# Patient Record
Sex: Male | Born: 1990 | Race: White | Hispanic: No | Marital: Single
Health system: Southern US, Community
[De-identification: ages and names within clinical notes are randomized; demographics above are authoritative.]

## PROBLEM LIST (undated history)

## (undated) ENCOUNTER — Ambulatory Visit: Admission: EM | Payer: Medicaid Other | Source: Home / Self Care

## (undated) DIAGNOSIS — F209 Schizophrenia, unspecified: Secondary | ICD-10-CM

## (undated) DIAGNOSIS — F419 Anxiety disorder, unspecified: Secondary | ICD-10-CM

## (undated) DIAGNOSIS — F909 Attention-deficit hyperactivity disorder, unspecified type: Secondary | ICD-10-CM

## (undated) HISTORY — PX: ANKLE SURGERY: SHX546

---
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
## 2012-03-23 ENCOUNTER — Emergency Department: Payer: Self-pay | Admitting: Emergency Medicine

## 2012-03-23 LAB — CBC
HCT: 41.1 % (ref 40.0–52.0)
HGB: 14.4 g/dL (ref 13.0–18.0)
MCH: 30.1 pg (ref 26.0–34.0)
MCHC: 35.1 g/dL (ref 32.0–36.0)
MCV: 86 fL (ref 80–100)
RBC: 4.78 10*6/uL (ref 4.40–5.90)
RDW: 12.5 % (ref 11.5–14.5)
WBC: 6.2 10*3/uL (ref 3.8–10.6)

## 2012-03-23 LAB — URINALYSIS, COMPLETE
Bilirubin,UR: NEGATIVE
Glucose,UR: NEGATIVE mg/dL (ref 0–75)
Leukocyte Esterase: NEGATIVE
Nitrite: NEGATIVE
Ph: 7 (ref 4.5–8.0)
RBC,UR: NONE SEEN /HPF (ref 0–5)
WBC UR: 1 /HPF (ref 0–5)

## 2012-03-23 LAB — BASIC METABOLIC PANEL
Calcium, Total: 8.7 mg/dL — ABNORMAL LOW (ref 9.0–10.7)
Co2: 24 mmol/L (ref 21–32)
Creatinine: 1.17 mg/dL (ref 0.60–1.30)
Glucose: 80 mg/dL (ref 65–99)
Potassium: 3.6 mmol/L (ref 3.5–5.1)

## 2012-03-24 LAB — DRUG SCREEN, URINE
Amphetamines, Ur Screen: NEGATIVE (ref ?–1000)
Barbiturates, Ur Screen: NEGATIVE (ref ?–200)
Cannabinoid 50 Ng, Ur ~~LOC~~: POSITIVE (ref ?–50)
MDMA (Ecstasy)Ur Screen: NEGATIVE (ref ?–500)
Methadone, Ur Screen: NEGATIVE (ref ?–300)
Opiate, Ur Screen: NEGATIVE (ref ?–300)
Phencyclidine (PCP) Ur S: NEGATIVE (ref ?–25)
Tricyclic, Ur Screen: NEGATIVE (ref ?–1000)

## 2012-03-24 LAB — ETHANOL: Ethanol %: <0.003 % (ref 0.000–0.080)

## 2018-10-13 ENCOUNTER — Encounter: Payer: Self-pay | Admitting: Emergency Medicine

## 2018-10-13 ENCOUNTER — Other Ambulatory Visit: Payer: Self-pay

## 2018-10-13 ENCOUNTER — Emergency Department
Admission: EM | Admit: 2018-10-13 | Discharge: 2018-10-13 | Disposition: A | Discharge status: Home / Self Care | Payer: Self-pay | Attending: Emergency Medicine | Admitting: Emergency Medicine

## 2018-10-13 ENCOUNTER — Emergency Department: Payer: Self-pay

## 2018-10-13 DIAGNOSIS — Y999 Unspecified external cause status: Secondary | ICD-10-CM | POA: Insufficient documentation

## 2018-10-13 DIAGNOSIS — S93401A Sprain of unspecified ligament of right ankle, initial encounter: Secondary | ICD-10-CM | POA: Insufficient documentation

## 2018-10-13 DIAGNOSIS — Y929 Unspecified place or not applicable: Secondary | ICD-10-CM | POA: Insufficient documentation

## 2018-10-13 DIAGNOSIS — Y9375 Activity, martial arts: Secondary | ICD-10-CM | POA: Insufficient documentation

## 2018-10-13 DIAGNOSIS — X509XXA Other and unspecified overexertion or strenuous movements or postures, initial encounter: Secondary | ICD-10-CM | POA: Insufficient documentation

## 2018-10-13 MED ORDER — TRAMADOL HCL 50 MG PO TABS
50.0000 mg | ORAL_TABLET | Freq: Once | ORAL | Status: AC
  Administered 2018-10-13: 50 mg via ORAL
  Filled 2018-10-13: qty 1

## 2018-10-13 MED ORDER — TRAMADOL HCL 50 MG PO TABS: 50.0000 mg | ORAL_TABLET | Freq: Two times a day (BID) | ORAL | 0 refills | Status: DC | PRN

## 2018-10-13 MED ORDER — IBUPROFEN 600 MG PO TABS: 600.0000 mg | ORAL_TABLET | Freq: Three times a day (TID) | ORAL | 0 refills | Status: DC | PRN

## 2018-10-13 MED ORDER — IBUPROFEN 600 MG PO TABS
600.0000 mg | ORAL_TABLET | Freq: Once | ORAL | Status: AC
  Administered 2018-10-13: 600 mg via ORAL
  Filled 2018-10-13: qty 1

## 2018-10-13 NOTE — Discharge Instructions (Signed)
Follow discharge care instructions and take medication as directed.  Ambulate with support for 2 to 3 days as needed.

## 2018-10-13 NOTE — ED Triage Notes (Signed)
Pt reports 3 days ago was in martial arts and rolled his right ankle. Pt states unable to walk on it now.

## 2018-10-13 NOTE — ED Notes (Signed)
See triage note  Presents with pain to right ankle/foot  States he was in a martial arts class 2 days ago   Rolled his ankle and felt a pop  Min swelling noted  Good pulses

## 2018-10-13 NOTE — ED Provider Notes (Signed)
[facility] Emergency Department Provider Note   ____________________________________________   First MD Initiated Contact with Patient 10/13/18 [phone]     (approximate)  I have reviewed the triage vital signs and the nursing notes.   HISTORY  Chief Complaint Ankle Pain    HPI Brandon Krueger is a 28 y.o. male patient complain of right ankle pain secondary to martial art trauma 2 days ago.  Patient state using "Rice" therapy and ambulating with crutches since incident.  Patient rates the pain is 8/10.  Patient described the pain is "achy".         History reviewed. No pertinent past medical history.  There are no active problems to display for this patient.   History reviewed. No pertinent surgical history.  Prior to Admission medications   Medication Sig Start Date End Date Taking? Authorizing Provider  gabapentin (NEURONTIN) 100 MG capsule Take 100 mg by mouth 3 (three) times daily.   Yes [provider]  ibuprofen (ADVIL) 600 MG tablet Take 1 tablet (600 mg total) by mouth every 8 (eight) hours as needed. 10/13/18   Joni Reining, PA-C  traMADol (ULTRAM) 50 MG tablet Take 1 tablet (50 mg total) by mouth every 12 (twelve) hours as needed. 10/13/18   Joni Reining, PA-C    Allergies Patient has no allergy information on record.  No family history on file.  Social History Social History   Tobacco Use  . Smoking status: Not on file  Substance Use Topics  . Alcohol use: Not on file  . Drug use: Not on file    Review of Systems Constitutional: No fever/chills Eyes: No visual changes. ENT: No sore throat. Cardiovascular: Denies chest pain. Respiratory: Denies shortness of breath. Gastrointestinal: No abdominal pain.  No nausea, no vomiting.  No diarrhea.  No constipation. Genitourinary: Negative for dysuria. Musculoskeletal: Right ankle pain.   Skin: Negative for rash. Neurological: Negative for headaches, focal weakness or  numbness.   ____________________________________________   PHYSICAL EXAM:  VITAL SIGNS: ED Triage Vitals  Enc Vitals Group     BP 10/13/18 0951 129/73     Pulse Rate 10/13/18 0951 89     Resp 10/13/18 0951 20     Temp 10/13/18 0951 98.1 F (36.7 C)     Temp Source 10/13/18 0951 Oral     SpO2 10/13/18 0951 98 %     Weight 10/13/18 0952 150 lb (68 kg)     Height 10/13/18 0952 5\' 11"  (1.803 m)     Head Circumference --      Peak Flow --      Pain Score 10/13/18 0952 8     Pain Loc --      Pain Edu? --      Excl. in [place]? --    Constitutional: Alert and oriented. Well appearing and in no acute distress. Hematological/Lymphatic/Immunilogical: No cervical lymphadenopathy. Cardiovascular: Normal rate, regular rhythm. Grossly normal heart sounds.  Good peripheral circulation. Respiratory: Normal respiratory effort.  No retractions. Lungs CTAB. Musculoskeletal: No obvious deformity to the right ankle.  Patient is moderate guarding palpation the lateral malleolus.  Neurologic:  Normal speech and language. No gross focal neurologic deficits are appreciated. No gait instability. Skin:  Skin is warm, dry and intact. No rash noted.  Ecchymosis or abrasion. Psychiatric: Mood and affect are normal. Speech and behavior are normal.  ____________________________________________   LABS (all labs ordered are listed, but only abnormal results are displayed)  Labs Reviewed -  No data to display ____________________________________________  EKG   ____________________________________________  RADIOLOGY  ED MD interpretation:    Official radiology report(s): Dg Ankle Complete Right  Result Date: 10/13/2018 CLINICAL DATA:  Right ankle twisting injury 3 days ago performing martial arts. Pain. Initial encounter. EXAM: RIGHT ANKLE - COMPLETE 3+ VIEW COMPARISON:  None. FINDINGS: There is no evidence of fracture, dislocation, or joint effusion. There is no evidence of arthropathy or other focal  bone abnormality. Soft tissues are unremarkable. IMPRESSION: Normal exam. Electronically Signed   By: Inge Rise M.D.   On: 10/13/2018 10:12    ____________________________________________   PROCEDURES  Procedure(s) performed (including Critical Care):  Procedures   ____________________________________________   INITIAL IMPRESSION / ASSESSMENT AND PLAN / ED COURSE  As part of my medical decision making, I reviewed the following data within the [place] was evaluated in Emergency Department on 10/13/2018 for the symptoms described in the history of present illness. He was evaluated in the context of the global COVID-19 pandemic, which necessitated consideration that the patient might be at risk for infection with the SARS-CoV-2 virus that causes COVID-19. Institutional protocols and algorithms that pertain to the evaluation of patients at risk for COVID-19 are in a state of rapid change based on information released by regulatory bodies including the CDC and federal and state organizations. These policies and algorithms were followed during the patient's care in the ED.   Patient presents with right lateral ankle pain secondary to a sprain doing martial arts competition.  Physical exam revealed mild edema to the lateral aspect of the ankle.  Discussed neck x-ray findings with patient.  Patient placed in ankle splint and given discharge care instructions with a work note.  Patient advised follow-up [facility] clinic if condition persist.       ____________________________________________   FINAL CLINICAL IMPRESSION(S) / ED DIAGNOSES  Final diagnoses:  Sprain of right ankle, unspecified ligament, initial encounter     ED Discharge Orders         Ordered    ibuprofen (ADVIL) 600 MG tablet  Every 8 hours PRN     10/13/18 1024    traMADol (ULTRAM) 50 MG tablet  Every 12 hours PRN     10/13/18 1024           Note:  This document  was prepared using Dragon voice recognition software and may include unintentional dictation errors.    Sable Feil, PA-C 10/13/18 1028    Lavonia Drafts, MD 10/13/18 [phone]

## 2018-11-13 ENCOUNTER — Other Ambulatory Visit: Payer: Self-pay

## 2018-11-13 ENCOUNTER — Emergency Department
Admission: EM | Admit: 2018-11-13 | Discharge: 2018-11-14 | Disposition: A | Discharge status: Home / Self Care | Payer: Self-pay | Attending: Emergency Medicine | Admitting: Emergency Medicine

## 2018-11-13 DIAGNOSIS — F4324 Adjustment disorder with disturbance of conduct: Secondary | ICD-10-CM | POA: Insufficient documentation

## 2018-11-13 DIAGNOSIS — Z79899 Other long term (current) drug therapy: Secondary | ICD-10-CM | POA: Insufficient documentation

## 2018-11-13 DIAGNOSIS — Z20828 Contact with and (suspected) exposure to other viral communicable diseases: Secondary | ICD-10-CM | POA: Insufficient documentation

## 2018-11-13 DIAGNOSIS — Z008 Encounter for other general examination: Secondary | ICD-10-CM

## 2018-11-13 DIAGNOSIS — R4689 Other symptoms and signs involving appearance and behavior: Secondary | ICD-10-CM | POA: Diagnosis present

## 2018-11-13 DIAGNOSIS — R456 Violent behavior: Secondary | ICD-10-CM | POA: Insufficient documentation

## 2018-11-13 LAB — COMPREHENSIVE METABOLIC PANEL
ALT: 18 U/L (ref 0–44)
AST: 17 U/L (ref 15–41)
Albumin: 4.8 g/dL (ref 3.5–5.0)
Alkaline Phosphatase: 71 U/L (ref 38–126)
Anion gap: 10 (ref 5–15)
BUN: 12 mg/dL (ref 6–20)
CO2: 28 mmol/L (ref 22–32)
Calcium: 9.8 mg/dL (ref 8.9–10.3)
Chloride: 102 mmol/L (ref 98–111)
Creatinine, Ser: 1.15 mg/dL (ref 0.61–1.24)
GFR calc Af Amer: >60 mL/min (ref >60)
GFR calc non Af Amer: >60 mL/min (ref >60)
Glucose, Bld: 98 mg/dL (ref 70–99)
Potassium: 3.8 mmol/L (ref 3.5–5.1)
Sodium: 140 mmol/L (ref 135–145)
Total Bilirubin: 1.1 mg/dL (ref 0.3–1.2)
Total Protein: 7.6 g/dL (ref 6.5–8.1)

## 2018-11-13 LAB — CBC
HCT: 40.4 % (ref 39.0–52.0)
Hemoglobin: 13.8 g/dL (ref 13.0–17.0)
MCH: 30.1 pg (ref 26.0–34.0)
MCHC: 34.2 g/dL (ref 30.0–36.0)
MCV: 88 fL (ref 80.0–100.0)
Platelets: 258 10*3/uL (ref 150–400)
RBC: 4.59 MIL/uL (ref 4.22–5.81)
RDW: 12.2 % (ref 11.5–15.5)
WBC: 9.6 10*3/uL (ref 4.0–10.5)
nRBC: 0 % (ref 0.0–0.2)

## 2018-11-13 LAB — ETHANOL: Alcohol, Ethyl (B): <10 mg/dL (ref <10)
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
## 2018-11-13 NOTE — ED Notes (Signed)
Pt given hospital scrubs and belongings bag. 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 Belongings include: Sneakers, Tan pants w/ belt, Underwear, Black socks, [place] Information systems manager, sunglasses, cell phone, money clip.
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
## 2018-11-14 DIAGNOSIS — F4324 Adjustment disorder with disturbance of conduct: Secondary | ICD-10-CM | POA: Insufficient documentation

## 2018-11-14 DIAGNOSIS — R4689 Other symptoms and signs involving appearance and behavior: Secondary | ICD-10-CM

## 2018-11-14 LAB — SARS CORONAVIRUS 2 (TAT 6-24 HRS): SARS Coronavirus 2: NEGATIVE
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
## 2018-11-14 NOTE — ED Notes (Signed)
Patient's dad came to visit patient, Patient and He had supervised visit, visit was calm and pleasant, will continue to monitor. Patient is calm and cooperative.
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
## 2018-11-14 NOTE — ED Notes (Signed)
Hourly rounding reveals patient in room. No complaints, stable, in no acute distress. Q15 minute rounds and monitoring via Security Cameras to continue. 

## 2018-11-14 NOTE — ED Notes (Signed)
Snack and beverage given. 

## 2018-11-14 NOTE — BH Assessment (Signed)
Writer spoke with patient to complete updated/reassessment. Patient denies SI/HI and AV/H. 

## 2018-11-14 NOTE — ED Notes (Signed)
Nurse talked to Patient and He denies Si/hi or avh, states that He was upset about His brother telling His girlfriend that He cheated on her, but He never wanted to hurt anyone or himself, talked about His prison time and How He is glad to be out of jail and wants to do better in life, nurse will continue to monitor.

## 2018-11-14 NOTE — ED Notes (Signed)
Patient ate 100% of breakfast, no signs of distress, will continue to monitor, q 15 miinute checks and camera surveillance in progress for safety.

## 2018-11-14 NOTE — ED Notes (Signed)
Patient voices understanding of discharge instructions, all belongings given back him, He is calm and cooperative, no signs of distress.
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
## 2018-11-14 NOTE — ED Notes (Signed)
Pt. Transferred to [place] from ED to room 5 after screening for contraband. Report to include Situation, Background, Assessment and Recommendations from [facility]. Pt. Oriented to unit including Q15 minute rounds as well as the security cameras for their protection. Patient is alert and oriented, warm and dry in no acute distress. Patient denies SI, HI, and AVH. Pt. Encouraged to let me know if needs arise.

## 2018-11-14 NOTE — Discharge Instructions (Addendum)
You have been seen in the emergency department for a  psychiatric concern. You have been evaluated both medically as well as psychiatrically. Please follow-up with your outpatient resources provided. Return to the emergency department for any worsening symptoms, or any thoughts of hurting yourself or anyone else so that we may attempt to help you. 

## 2018-11-14 NOTE — ED Provider Notes (Signed)
-----------------------------------------   12:18 PM on 11/14/2018 -----------------------------------------  Patient has been seen and evaluated by psychiatry they believe the patient safe for discharge home from a psychiatric standpoint.  Medical work-up largely nonrevealing.  Patient will be discharged at this time.   Harvest Dark, MD 11/14/18 1218

## 2018-11-17 ENCOUNTER — Emergency Department: Admission: EM | Admit: 2018-11-17 | Discharge: 2018-11-17 | Discharge status: Against Medical Advice | Payer: Self-pay

## 2018-11-17 NOTE — ED Notes (Signed)
Pt not visualized in the [place], pt was brought in by EMS and was a/ox4 on arrival.

## 2018-12-18 ENCOUNTER — Other Ambulatory Visit: Payer: Self-pay

## 2018-12-18 ENCOUNTER — Emergency Department
Admission: EM | Admit: 2018-12-18 | Discharge: 2018-12-18 | Disposition: A | Discharge status: Home / Self Care | Payer: Self-pay | Attending: Emergency Medicine | Admitting: Emergency Medicine

## 2018-12-18 DIAGNOSIS — Z4802 Encounter for removal of sutures: Secondary | ICD-10-CM | POA: Insufficient documentation

## 2018-12-18 DIAGNOSIS — Z5321 Procedure and treatment not carried out due to patient leaving prior to being seen by health care provider: Secondary | ICD-10-CM | POA: Insufficient documentation

## 2018-12-18 NOTE — ED Triage Notes (Signed)
Suture removal. Sutures to left foot X 2 weeks ago from a surgery in [place]. Pt supposed to followup with podiatrist but does not have one from recent move to [place].

## 2019-04-21 ENCOUNTER — Emergency Department: Admission: EM | Admit: 2019-04-21 | Discharge: 2019-04-21 | Discharge status: Against Medical Advice | Payer: Self-pay

## 2021-01-06 ENCOUNTER — Emergency Department: Admission: EM | Admit: 2021-01-06 | Discharge: 2021-01-07 | Discharge status: Against Medical Advice | Payer: Self-pay

## 2021-01-06 NOTE — ED Notes (Signed)
Called pt several times no answer  

## 2021-03-06 ENCOUNTER — Emergency Department: Payer: Medicaid Other

## 2021-03-06 ENCOUNTER — Other Ambulatory Visit: Payer: Self-pay

## 2021-03-06 ENCOUNTER — Emergency Department
Admission: EM | Admit: 2021-03-06 | Discharge: 2021-03-07 | Disposition: A | Discharge status: Home / Self Care | Payer: Medicaid Other | Attending: Emergency Medicine | Admitting: Emergency Medicine

## 2021-03-06 DIAGNOSIS — R1031 Right lower quadrant pain: Secondary | ICD-10-CM | POA: Insufficient documentation

## 2021-03-06 DIAGNOSIS — L03319 Cellulitis of trunk, unspecified: Secondary | ICD-10-CM | POA: Insufficient documentation

## 2021-03-06 LAB — BASIC METABOLIC PANEL
Anion gap: 7 (ref 5–15)
BUN: 22 mg/dL — ABNORMAL HIGH (ref 6–20)
CO2: 26 mmol/L (ref 22–32)
Calcium: 9.1 mg/dL (ref 8.9–10.3)
Chloride: 105 mmol/L (ref 98–111)
Creatinine, Ser: 1.16 mg/dL (ref 0.61–1.24)
GFR, Estimated: >60 mL/min (ref >60)
Glucose, Bld: 91 mg/dL (ref 70–99)
Potassium: 4.3 mmol/L (ref 3.5–5.1)
Sodium: 138 mmol/L (ref 135–145)

## 2021-03-06 LAB — CBC WITH DIFFERENTIAL/PLATELET
Abs Immature Granulocytes: 0.04 10*3/uL (ref 0.00–0.07)
Basophils Absolute: 0.1 10*3/uL (ref 0.0–0.1)
Basophils Relative: 1 %
Eosinophils Absolute: 0 10*3/uL (ref 0.0–0.5)
Eosinophils Relative: 0 %
HCT: 41.9 % (ref 39.0–52.0)
Hemoglobin: 13.8 g/dL (ref 13.0–17.0)
Immature Granulocytes: 0 %
Lymphocytes Relative: 18 %
Lymphs Abs: 1.9 10*3/uL (ref 0.7–4.0)
MCH: 28.9 pg (ref 26.0–34.0)
MCHC: 32.9 g/dL (ref 30.0–36.0)
MCV: 87.7 fL (ref 80.0–100.0)
Monocytes Absolute: 0.9 10*3/uL (ref 0.1–1.0)
Monocytes Relative: 9 %
Neutro Abs: 7.3 10*3/uL (ref 1.7–7.7)
Neutrophils Relative %: 72 %
Platelets: 313 10*3/uL (ref 150–400)
RBC: 4.78 MIL/uL (ref 4.22–5.81)
RDW: 12.7 % (ref 11.5–15.5)
WBC: 10.2 10*3/uL (ref 4.0–10.5)
nRBC: 0 % (ref 0.0–0.2)

## 2021-03-06 LAB — CHLAMYDIA/NGC RT PCR (ARMC ONLY)
Chlamydia Tr: NOT DETECTED
N gonorrhoeae: NOT DETECTED

## 2021-03-06 MED ORDER — IBUPROFEN 600 MG PO TABS: 600.0000 mg | ORAL_TABLET | Freq: Three times a day (TID) | ORAL | 0 refills | Status: AC | PRN

## 2021-03-06 MED ORDER — FENTANYL CITRATE PF 50 MCG/ML IJ SOSY
75.0000 ug | PREFILLED_SYRINGE | Freq: Once | INTRAMUSCULAR | Status: AC
  Administered 2021-03-06: 75 ug via INTRAVENOUS
  Filled 2021-03-06: qty 2

## 2021-03-06 MED ORDER — SODIUM CHLORIDE 0.9 % IV SOLN
2.0000 g | Freq: Once | INTRAVENOUS | Status: AC
  Administered 2021-03-06: 2 g via INTRAVENOUS
  Filled 2021-03-06: qty 20

## 2021-03-06 MED ORDER — MUPIROCIN CALCIUM 2 % EX CREA: 1.0000 "application " | TOPICAL_CREAM | Freq: Two times a day (BID) | CUTANEOUS | 0 refills | Status: AC

## 2021-03-06 MED ORDER — SODIUM CHLORIDE 0.9 % IV BOLUS
1000.0000 mL | Freq: Once | INTRAVENOUS | Status: AC
  Administered 2021-03-06: 1000 mL via INTRAVENOUS

## 2021-03-06 MED ORDER — IOHEXOL 300 MG/ML  SOLN
100.0000 mL | Freq: Once | INTRAMUSCULAR | Status: AC | PRN
  Administered 2021-03-06: 100 mL via INTRAVENOUS

## 2021-03-06 MED ORDER — LIDOCAINE-EPINEPHRINE 2 %-1:100000 IJ SOLN
20.0000 mL | Freq: Once | INTRAMUSCULAR | Status: AC
Start: 2021-03-06 — End: 2021-03-06
  Administered 2021-03-06: 20 mL via INTRADERMAL
  Filled 2021-03-06: qty 1

## 2021-03-06 MED ORDER — KETOROLAC TROMETHAMINE 30 MG/ML IJ SOLN
15.0000 mg | Freq: Once | INTRAMUSCULAR | Status: AC
  Administered 2021-03-06: 15 mg via INTRAVENOUS
  Filled 2021-03-06: qty 1

## 2021-03-06 MED ORDER — ONDANSETRON HCL 4 MG/2ML IJ SOLN
4.0000 mg | Freq: Once | INTRAMUSCULAR | Status: AC
  Administered 2021-03-06: 4 mg via INTRAVENOUS
  Filled 2021-03-06: qty 2

## 2021-03-06 MED ORDER — DEXTROSE 5 % IV SOLN
1500.0000 mg | Freq: Once | INTRAVENOUS | Status: AC
  Administered 2021-03-07: 1500 mg via INTRAVENOUS
  Filled 2021-03-06: qty 75

## 2021-03-06 MED ORDER — AZITHROMYCIN 1 G PO PACK
1.0000 g | PACK | Freq: Once | ORAL | Status: AC
Start: 2021-03-06 — End: 2021-03-07
  Administered 2021-03-07: 1 g via ORAL
  Filled 2021-03-06: qty 1

## 2021-03-06 NOTE — ED Triage Notes (Signed)
Pt comes with c/o abscess. Pt states it started 4 days ago. Pt states it has been draining and he thought it was getting better but then it got worse.

## 2021-03-06 NOTE — ED Provider Notes (Incomplete Revision)
[facility] Provider Note    Event Date/Time   First MD Initiated Contact with Patient 03/06/21 2115     (approximate)   History   Abscess   HPI  Brandon Krueger is a 31 y.o. male here with redness and abscess to the right inguinal area.  The patient states that approximately week ago, he began to develop redness, pain, to his right inguinal area at his baseline.  He states that it started as a small red bump, then began draining.  Has not spread.  He took some doxycycline from his boss, but is unsure what dose and had only taken it several times.  He states that over the last several days, the redness has worsened so he presents for evaluation.  Denies known fevers but has had some chills.  No history of MRSA or complicated infections.  No other complaints.  No nausea or vomiting.     Physical Exam   Triage Vital Signs: ED Triage Vitals  Enc Vitals Group     BP 03/06/21 1931 135/68     Pulse Rate 03/06/21 1931 63     Resp 03/06/21 1931 18     Temp 03/06/21 1931 98.3 F (36.8 C)     Temp Source 03/06/21 1931 Oral     SpO2 03/06/21 1931 98 %     Weight --      Height --      Head Circumference --      Peak Flow --      Pain Score 03/06/21 1858 6     Pain Loc --      Pain Edu? --      Excl. in GC? --     Most recent vital signs: Vitals:   03/06/21 2320 03/06/21 2326  BP: 117/69   Pulse:  63  Resp:    Temp:    SpO2:  99%     General: Awake, no distress.  CV:  Good peripheral perfusion.  Resp:  Normal effort.  Abd:  No distention.  Other:  Right inguinal area with an approximately 7 x 3 cm area of induration with a small focal area of drainage.  No overt fluctuance.  No crepitance.   ED Results / Procedures / Treatments   Labs (all labs ordered are listed, but only abnormal results are displayed) Labs Reviewed  BASIC METABOLIC PANEL - Abnormal; Notable for the following components:      Result Value   BUN 22 (*)    All other  components within normal limits  CHLAMYDIA/NGC RT PCR ([facility] ONLY)            AEROBIC/ANAEROBIC CULTURE W GRAM STAIN (SURGICAL/DEEP WOUND)  CBC WITH DIFFERENTIAL/PLATELET  HIV ANTIBODY (ROUTINE TESTING W REFLEX)  RPR     EKG    RADIOLOGY CT: Soft tissue stranding in the anterior subcutaneous tissues of the right lower abdomen compatible with cellulitis, no discrete fluid collection.   I also independently reviewed and agree wit radiologist interpretations.   PROCEDURES:  Critical Care performed: No  Procedures    MEDICATIONS ORDERED IN ED: Medications  dalbavancin (DALVANCE) 1,500 mg in dextrose 5 % 500 mL IVPB (has no administration in time range)  azithromycin (ZITHROMAX) powder 1 g (has no administration in time range)  iohexol (OMNIPAQUE) 300 MG/ML solution 100 mL (100 mLs Intravenous Contrast Given 03/06/21 2055)  ketorolac (TORADOL) 30 MG/ML injection 15 mg (15 mg Intravenous Given 03/06/21 2229)  fentaNYL (SUBLIMAZE) injection 75 mcg (  75 mcg Intravenous Given 03/06/21 2212)  ondansetron (ZOFRAN) injection 4 mg (4 mg Intravenous Given 03/06/21 2229)  sodium chloride 0.9 % bolus 1,000 mL (1,000 mLs Intravenous New Bag/Given 03/06/21 2235)  cefTRIAXone (ROCEPHIN) 2 g in sodium chloride 0.9 % 100 mL IVPB (0 g Intravenous Stopped 03/06/21 2325)  lidocaine-EPINEPHrine (XYLOCAINE W/EPI) 2 %-1:100000 (with pres) injection 20 mL (20 mLs Intradermal Given by Other 03/06/21 2232)     IMPRESSION / MDM / ASSESSMENT AND PLAN / ED COURSE  I reviewed the triage vital signs and the nursing notes.                               The patient is on the cardiac monitor to evaluate for evidence of arrhythmia and/or significant heart rate changes.   Ddx:  Cellulitis with abscess, folliculitis, chancroid, contact dermatitis  MDM:  31 year old male here with rash and drainage to the right groin area.  Clinically, suspect focal folliculitis with superimposed cellulitis.  He has questionably  been on doxycycline although it sounds like this was a lower dose and was given by a friend.  Clinically, he does not appear septic.  His vital signs are stable.  No significant leukocytosis.  No clinical evidence of sepsis.  Patient was sent for CT scan, which was reviewed and shows no evidence of abscess or deep infection.  He does have a superficial area of drainage, this was sharply excised to viable tissue and the area was probed with no evidence of purulence or deeper abscess that was occult on CT.  Patient tolerated the procedure well.  Of note, patient has fairly high risk sexual activity and is requesting STD testing.  Chancroid is certainly a consideration.  Azithromycin given.  Given that he reports he has been on doxycycline though likely subtherapeutic, with worsening redness and significant induration and focal cellulitis on exam, but no other indications for admission, and desire to not take full course of antibiotics as an outpatient with concern for possible adherence, feel patient is reasonable candidate for dalbavancin.  This was given here.  Otherwise, will give NSAIDs for pain and he will follow-up with infectious disease, which is also reasonable given his high risk sexual activity and I have sent an RPR as well as a culture to evaluate for possible chancroid.   MEDICATIONS GIVEN IN ED: Medications  dalbavancin (DALVANCE) 1,500 mg in dextrose 5 % 500 mL IVPB (has no administration in time range)  azithromycin (ZITHROMAX) powder 1 g (has no administration in time range)  iohexol (OMNIPAQUE) 300 MG/ML solution 100 mL (100 mLs Intravenous Contrast Given 03/06/21 2055)  ketorolac (TORADOL) 30 MG/ML injection 15 mg (15 mg Intravenous Given 03/06/21 2229)  fentaNYL (SUBLIMAZE) injection 75 mcg (75 mcg Intravenous Given 03/06/21 2212)  ondansetron (ZOFRAN) injection 4 mg (4 mg Intravenous Given 03/06/21 2229)  sodium chloride 0.9 % bolus 1,000 mL (1,000 mLs Intravenous New Bag/Given 03/06/21  2235)  cefTRIAXone (ROCEPHIN) 2 g in sodium chloride 0.9 % 100 mL IVPB (0 g Intravenous Stopped 03/06/21 2325)  lidocaine-EPINEPHrine (XYLOCAINE W/EPI) 2 %-1:100000 (with pres) injection 20 mL (20 mLs Intradermal Given by Other 03/06/21 2232)     Consults:     EMR reviewed       FINAL CLINICAL IMPRESSION(S) / ED DIAGNOSES   Final diagnoses:  Cellulitis of trunk, unspecified site of trunk     Rx / DC Orders   ED Discharge Orders  Ordered    Ambulatory referral to Infectious Disease       Comments: Cellulitis patient:  Received dalbavancin on 03/06/2021.   03/06/21 2128    mupirocin cream (BACTROBAN) 2 %  2 times daily        03/06/21 2339    ibuprofen (ADVIL) 600 MG tablet  Every 8 hours PRN        03/06/21 2339             Note:  This document was prepared using Dragon voice recognition software and may include unintentional dictation errors.   Shaune Pollack, MD 03/06/21 [phone]

## 2021-03-06 NOTE — Discharge Instructions (Signed)
You have been given a one-time dose of a strong antibiotic here which should help with treatment.  I would recommend following up with infectious disease within the next week.  They should call you as receiving this antibiotic puts you on a call list for them.  If you have not heard from them within the next 24 hours, call the clinic for an appointment.

## 2021-03-06 NOTE — ED Notes (Signed)
Pt has red area approx 6 in wide with opening in middle draining puss on right lower abdomen where his pants hit the waist.

## 2021-03-06 NOTE — ED Provider Notes (Addendum)
[facility] Provider Note    Event Date/Time   First MD Initiated Contact with Patient 03/06/21 2115     (approximate)   History   Abscess   HPI  Brandon Krueger is a 31 y.o. male here with redness and abscess to the right inguinal area.  The patient states that approximately week ago, he began to develop redness, pain, to his right inguinal area at his baseline.  He states that it started as a small red bump, then began draining.  Has not spread.  He took some doxycycline from his boss, but is unsure what dose and had only taken it several times.  He states that over the last several days, the redness has worsened so he presents for evaluation.  Denies known fevers but has had some chills.  No history of MRSA or complicated infections.  No other complaints.  No nausea or vomiting.     Physical Exam   Triage Vital Signs: ED Triage Vitals  Enc Vitals Group     BP 03/06/21 1931 135/68     Pulse Rate 03/06/21 1931 63     Resp 03/06/21 1931 18     Temp 03/06/21 1931 98.3 F (36.8 C)     Temp Source 03/06/21 1931 Oral     SpO2 03/06/21 1931 98 %     Weight --      Height --      Head Circumference --      Peak Flow --      Pain Score 03/06/21 1858 6     Pain Loc --      Pain Edu? --      Excl. in GC? --     Most recent vital signs: Vitals:   03/06/21 2326 03/07/21 0055  BP:  126/71  Pulse: 63 66  Resp:  16  Temp:  98.1 F (36.7 C)  SpO2: 99% 99%     General: Awake, no distress.  CV:  Good peripheral perfusion.  Resp:  Normal effort.  Abd:  No distention.  Other:  Right inguinal area with an approximately 7 x 3 cm area of induration with a small focal area of drainage.  No overt fluctuance.  No crepitance.     ED Results / Procedures / Treatments   Labs (all labs ordered are listed, but only abnormal results are displayed) Labs Reviewed  BASIC METABOLIC PANEL - Abnormal; Notable for the following components:      Result Value    BUN 22 (*)    All other components within normal limits  CHLAMYDIA/NGC RT PCR ([facility] ONLY)            AEROBIC/ANAEROBIC CULTURE W GRAM STAIN (SURGICAL/DEEP WOUND)  CBC WITH DIFFERENTIAL/PLATELET  HIV ANTIBODY (ROUTINE TESTING W REFLEX)  RPR     EKG    RADIOLOGY CT: Soft tissue stranding in the anterior subcutaneous tissues of the right lower abdomen compatible with cellulitis, no discrete fluid collection.   I also independently reviewed and agree wit radiologist interpretations.   PROCEDURES:  Critical Care performed: No  Procedures    MEDICATIONS ORDERED IN ED: Medications  iohexol (OMNIPAQUE) 300 MG/ML solution 100 mL (100 mLs Intravenous Contrast Given 03/06/21 2055)  ketorolac (TORADOL) 30 MG/ML injection 15 mg (15 mg Intravenous Given 03/06/21 2229)  fentaNYL (SUBLIMAZE) injection 75 mcg (75 mcg Intravenous Given 03/06/21 2212)  ondansetron (ZOFRAN) injection 4 mg (4 mg Intravenous Given 03/06/21 2229)  sodium chloride 0.9 % bolus 1,000  mL (0 mLs Intravenous Stopped 03/07/21 0052)  dalbavancin (DALVANCE) 1,500 mg in dextrose 5 % 500 mL IVPB (0 mg Intravenous Stopped 03/07/21 0052)  cefTRIAXone (ROCEPHIN) 2 g in sodium chloride 0.9 % 100 mL IVPB (0 g Intravenous Stopped 03/06/21 2325)  lidocaine-EPINEPHrine (XYLOCAINE W/EPI) 2 %-1:100000 (with pres) injection 20 mL (20 mLs Intradermal Given by Other 03/06/21 2232)  azithromycin (ZITHROMAX) powder 1 g (1 g Oral Given 03/07/21 0013)  oxyCODONE (Oxy IR/ROXICODONE) immediate release tablet 5 mg (5 mg Oral Given 03/07/21 0059)     IMPRESSION / MDM / ASSESSMENT AND PLAN / ED COURSE  I reviewed the triage vital signs and the nursing notes.                               The patient is on the cardiac monitor to evaluate for evidence of arrhythmia and/or significant heart rate changes.   Ddx:  Cellulitis with abscess, folliculitis, chancroid, contact dermatitis  MDM:  31 year old male here with rash and drainage to the right  groin area.  Clinically, suspect focal folliculitis with superimposed cellulitis.  He has questionably been on doxycycline although it sounds like this was a lower dose and was given by a friend.  Clinically, he does not appear septic.  His vital signs are stable.  No significant leukocytosis.  No clinical evidence of sepsis.  Patient was sent for CT scan, which was reviewed and shows no evidence of abscess or deep infection.  He does have a superficial area of drainage, this was sharply excised to viable tissue and the area was probed with no evidence of purulence or deeper abscess that was occult on CT.  Patient tolerated the procedure well.  Of note, patient has fairly high risk sexual activity and is requesting STD testing.  Chancroid is certainly a consideration.  Azithromycin given.  Given that he reports he has been on doxycycline though likely subtherapeutic, with worsening redness and significant induration and focal cellulitis on exam, but no other indications for admission, and desire to not take full course of antibiotics as an outpatient with concern for possible adherence, feel patient is reasonable candidate for dalbavancin.  This was given here.  Otherwise, will give NSAIDs for pain and he will follow-up with infectious disease, which is also reasonable given his high risk sexual activity and I have sent an RPR as well as a culture to evaluate for possible chancroid.   MEDICATIONS GIVEN IN ED: Medications  iohexol (OMNIPAQUE) 300 MG/ML solution 100 mL (100 mLs Intravenous Contrast Given 03/06/21 2055)  ketorolac (TORADOL) 30 MG/ML injection 15 mg (15 mg Intravenous Given 03/06/21 2229)  fentaNYL (SUBLIMAZE) injection 75 mcg (75 mcg Intravenous Given 03/06/21 2212)  ondansetron (ZOFRAN) injection 4 mg (4 mg Intravenous Given 03/06/21 2229)  sodium chloride 0.9 % bolus 1,000 mL (0 mLs Intravenous Stopped 03/07/21 0052)  dalbavancin (DALVANCE) 1,500 mg in dextrose 5 % 500 mL IVPB (0 mg  Intravenous Stopped 03/07/21 0052)  cefTRIAXone (ROCEPHIN) 2 g in sodium chloride 0.9 % 100 mL IVPB (0 g Intravenous Stopped 03/06/21 2325)  lidocaine-EPINEPHrine (XYLOCAINE W/EPI) 2 %-1:100000 (with pres) injection 20 mL (20 mLs Intradermal Given by Other 03/06/21 2232)  azithromycin (ZITHROMAX) powder 1 g (1 g Oral Given 03/07/21 0013)  oxyCODONE (Oxy IR/ROXICODONE) immediate release tablet 5 mg (5 mg Oral Given 03/07/21 0059)     Consults:  Discussed case with Pharmacy  EMR reviewed  Prior ED visits  FINAL CLINICAL IMPRESSION(S) / ED DIAGNOSES   Final diagnoses:  Cellulitis of trunk, unspecified site of trunk     Rx / DC Orders   ED Discharge Orders          Ordered    Ambulatory referral to Infectious Disease       Comments: Cellulitis patient:  Received dalbavancin on 03/06/2021.   03/06/21 2128    mupirocin cream (BACTROBAN) 2 %  2 times daily        03/06/21 2339    ibuprofen (ADVIL) 600 MG tablet  Every 8 hours PRN        03/06/21 2339             Note:  This document was prepared using Dragon voice recognition software and may include unintentional dictation errors.   Shaune Pollack, MD 03/06/21 2346    Shaune Pollack, MD 03/07/21 [phone]

## 2021-03-06 NOTE — ED Provider Triage Note (Signed)
°  Emergency Medicine Provider Triage Evaluation Note  Brandon Krueger , a 31 y.o.male,  was evaluated in triage.  Pt complains of abscess.  Patient states he began developing a abscess on his lower abdomen/pelvic region approximately 4 days ago.  He has been on doxycycline for the past couple days, however it has not improved and has only gotten worse.  Additionally endorses fever/chills.   Review of Systems  Positive: Abscess, fever/chills Negative: Denies abdominal pain, vomiting, headache  Physical Exam  There were no vitals filed for this visit. Gen:   Awake, no distress   Resp:  Normal effort  MSK:   Moves extremities without difficulty  Other:    Medical Decision Making  Given the patient's initial medical screening exam, the following diagnostic evaluation has been ordered. The patient will be placed in the appropriate treatment space, once one is available, to complete the evaluation and treatment. I have discussed the plan of care with the patient and I have advised the patient that an ED physician or mid-level practitioner will reevaluate their condition after the test results have been received, as the results may give them additional insight into the type of treatment they may need.    Diagnostics: Labs, abdominal CT  Treatments: none immediately   [place], [place], [place] 03/06/21 1909

## 2021-03-07 LAB — RPR: RPR Ser Ql: NONREACTIVE

## 2021-03-07 LAB — HIV ANTIBODY (ROUTINE TESTING W REFLEX): HIV Screen 4th Generation wRfx: NONREACTIVE

## 2021-03-07 MED ORDER — OXYCODONE HCL 5 MG PO TABS
5.0000 mg | ORAL_TABLET | Freq: Once | ORAL | Status: AC
  Administered 2021-03-07: 5 mg via ORAL
  Filled 2021-03-07: qty 1

## 2021-03-12 LAB — AEROBIC/ANAEROBIC CULTURE W GRAM STAIN (SURGICAL/DEEP WOUND)
Gram Stain: NONE SEEN
Special Requests: NORMAL

## 2021-03-20 ENCOUNTER — Ambulatory Visit: Payer: Medicaid Other | Admitting: Infectious Disease

## 2021-03-20 ENCOUNTER — Other Ambulatory Visit (HOSPITAL_COMMUNITY): Payer: Self-pay

## 2021-03-28 DIAGNOSIS — L0390 Cellulitis, unspecified: Secondary | ICD-10-CM | POA: Insufficient documentation

## 2021-03-28 DIAGNOSIS — Z87898 Personal history of other specified conditions: Secondary | ICD-10-CM | POA: Insufficient documentation

## 2021-03-28 DIAGNOSIS — F1910 Other psychoactive substance abuse, uncomplicated: Secondary | ICD-10-CM | POA: Insufficient documentation

## 2021-03-29 ENCOUNTER — Ambulatory Visit: Payer: Medicaid Other | Admitting: Internal Medicine

## 2021-03-29 DIAGNOSIS — Z87898 Personal history of other specified conditions: Secondary | ICD-10-CM

## 2021-03-29 DIAGNOSIS — L03314 Cellulitis of groin: Secondary | ICD-10-CM

## 2021-03-29 DIAGNOSIS — F1910 Other psychoactive substance abuse, uncomplicated: Secondary | ICD-10-CM

## 2022-06-19 ENCOUNTER — Emergency Department
Admission: EM | Admit: 2022-06-19 | Discharge: 2022-06-19 | Payer: Medicaid Other | Attending: Emergency Medicine | Admitting: Emergency Medicine

## 2022-06-19 ENCOUNTER — Emergency Department: Payer: Medicaid Other

## 2022-06-19 ENCOUNTER — Encounter: Payer: Self-pay | Admitting: Emergency Medicine

## 2022-06-19 ENCOUNTER — Other Ambulatory Visit: Payer: Self-pay

## 2022-06-19 DIAGNOSIS — S71111A Laceration without foreign body, right thigh, initial encounter: Secondary | ICD-10-CM | POA: Insufficient documentation

## 2022-06-19 DIAGNOSIS — S79921A Unspecified injury of right thigh, initial encounter: Secondary | ICD-10-CM | POA: Diagnosis present

## 2022-06-19 DIAGNOSIS — W540XXA Bitten by dog, initial encounter: Secondary | ICD-10-CM | POA: Insufficient documentation

## 2022-06-19 MED ORDER — IBUPROFEN 800 MG PO TABS
800.0000 mg | ORAL_TABLET | Freq: Once | ORAL | Status: AC
  Administered 2022-06-19: 800 mg via ORAL
  Filled 2022-06-19: qty 1

## 2022-06-19 MED ORDER — AMOXICILLIN-POT CLAVULANATE 875-125 MG PO TABS
1.0000 | ORAL_TABLET | Freq: Once | ORAL | Status: AC
  Administered 2022-06-19: 1 via ORAL
  Filled 2022-06-19: qty 1

## 2022-06-19 MED ORDER — AMOXICILLIN-POT CLAVULANATE 875-125 MG PO TABS: 1.0000 | ORAL_TABLET | Freq: Two times a day (BID) | ORAL | 0 refills | Status: AC

## 2022-06-19 MED ORDER — BACITRACIN ZINC 500 UNIT/GM EX OINT
TOPICAL_OINTMENT | Freq: Once | CUTANEOUS | Status: AC
  Administered 2022-06-19: 2 via TOPICAL
  Filled 2022-06-19: qty 0.9

## 2022-06-19 MED ORDER — LIDOCAINE HCL (PF) 1 % IJ SOLN
5.0000 mL | Freq: Once | INTRAMUSCULAR | Status: AC
  Administered 2022-06-19: 5 mL via INTRADERMAL
  Filled 2022-06-19: qty 5

## 2022-06-19 NOTE — ED Notes (Signed)
Right thigh bite wounds dressed with bacitracin ointment, non-adhesive bandages, and wrapped in curlex bandages.  Pt given DC instruction including care of wounds, and taking rx abx.  Pt escorted out of ER by [employer] officer.  Pt refused set of VS prior to DC.

## 2022-06-19 NOTE — ED Notes (Signed)
Pt has several deep lacerations on the inside of right thigh. Outer thigh has 1 deeper laceration. Bleeding minimal. Pulses 2+, Cap refill <3secs
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
## 2022-06-19 NOTE — Discharge Instructions (Signed)
Monitor for signs of infection.  Sutures need to be rechecked in 10 days. If wound is well approximated and no sign of infection, sutures can be removed. Otherwise, leave in for up to 4 more days.

## 2022-06-19 NOTE — ED Notes (Signed)
Radiology at bedside

## 2023-06-20 IMAGING — CT CT ABD-PELV W/ CM
2 of 4 series · 16 of 46 positions shown, 18 images · IV contrast (APPLIED)
Comparison: None.

CLINICAL DATA: Intra-abdominal abscess Abdominal/Pelvic abscess

EXAM:
CT ABDOMEN AND PELVIS WITH CONTRAST
TECHNIQUE: Multidetector CT imaging of the abdomen and pelvis was performed
using the standard protocol following bolus administration of
intravenous contrast.

[Series 2: routine abd/pel with · axial · 0.80mm/px · z∈[-998,-584]mm · 13 of 91 slices shown, 15 images]
[im 4/91  soft-tissue]
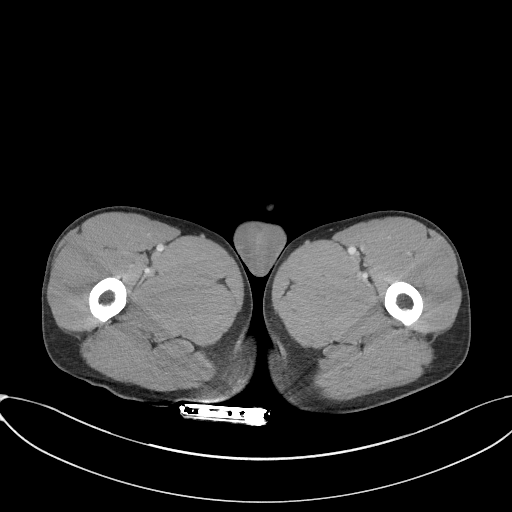
[im 4/91  bone]
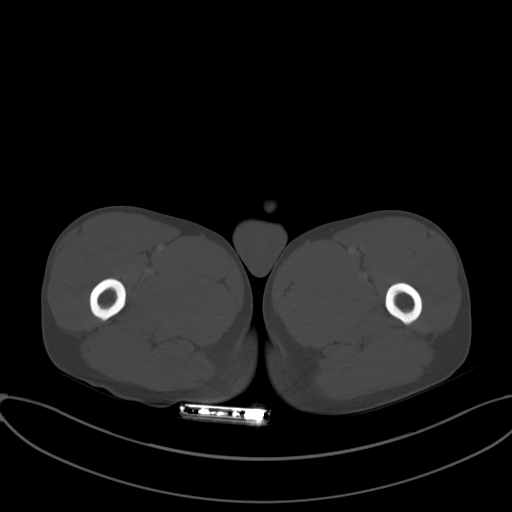
[im 12/91  soft-tissue]
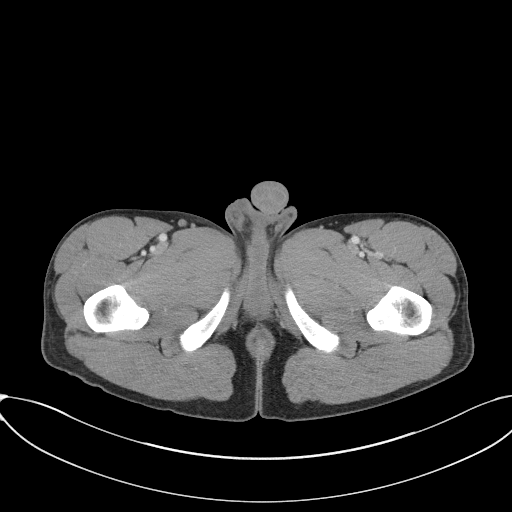
[im 20/91  soft-tissue]
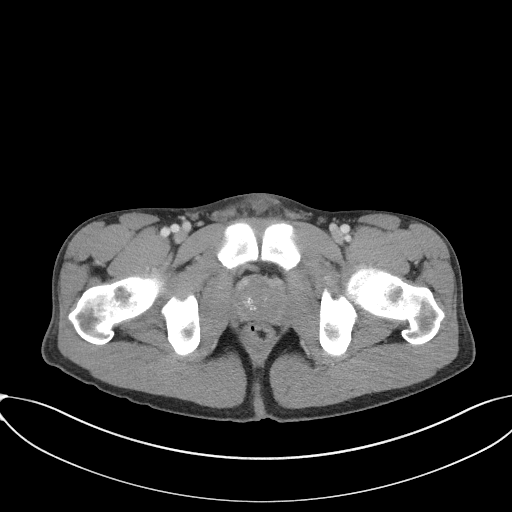
[im 24/91  soft-tissue]
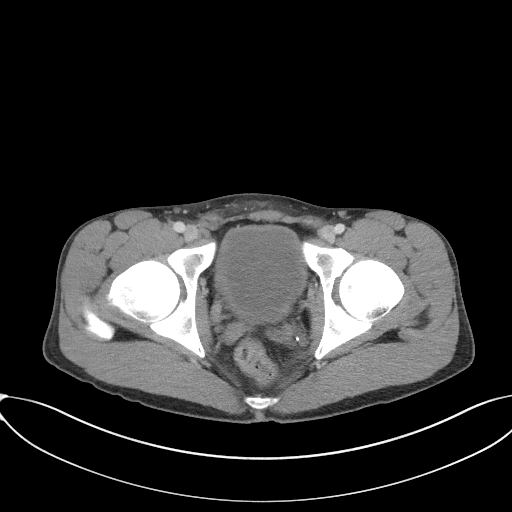
[im 32/91  soft-tissue]
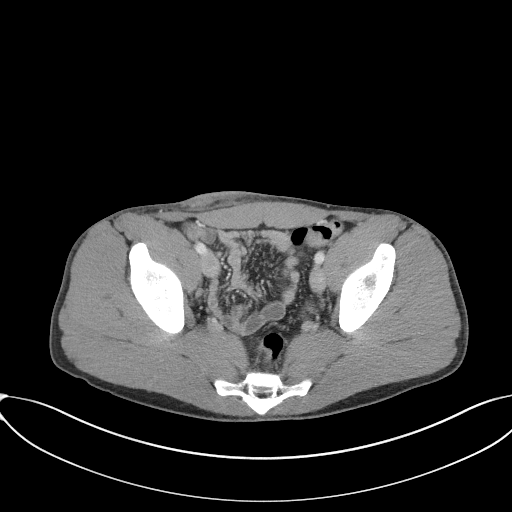
[im 40/91  soft-tissue]
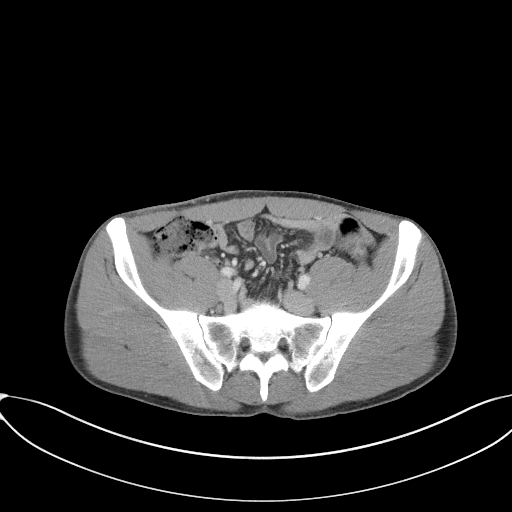
[im 47/91  soft-tissue]
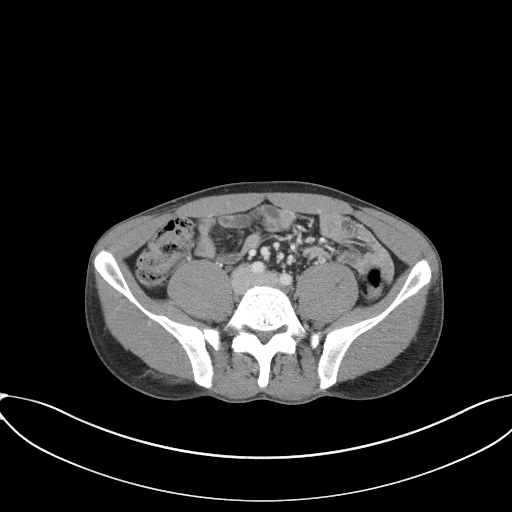
[im 51/91  soft-tissue]
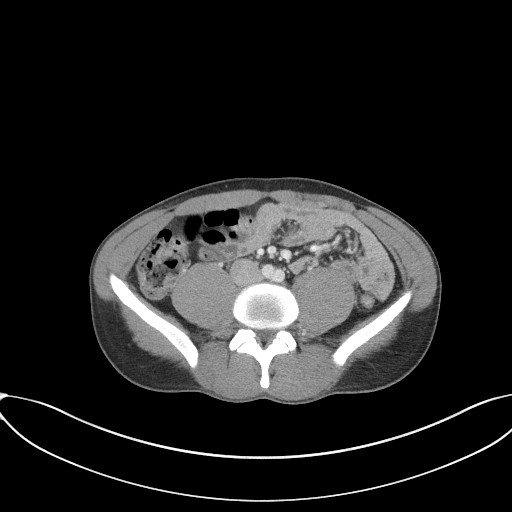
[im 59/91  soft-tissue]
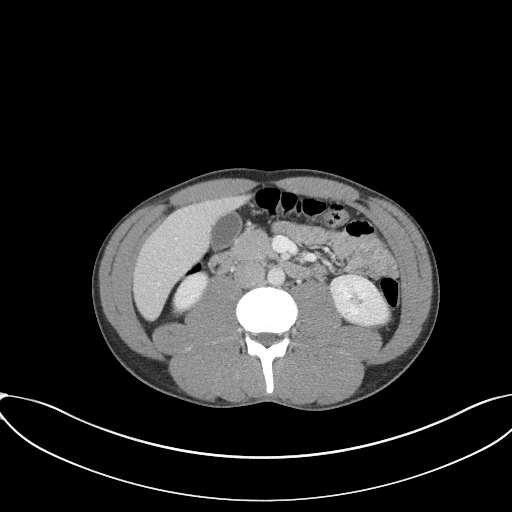
[im 59/91  bone]
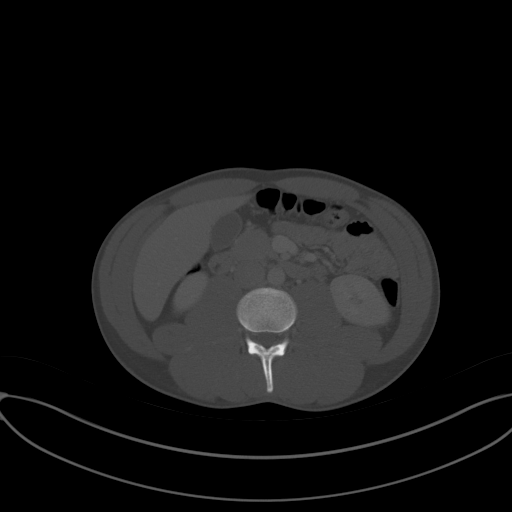
[im 67/91  soft-tissue]
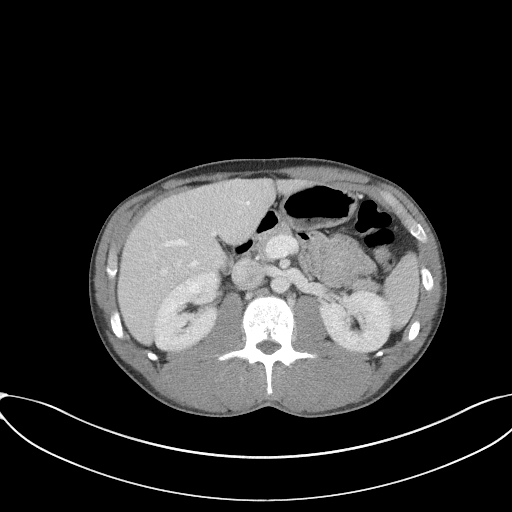
[im 71/91  soft-tissue]
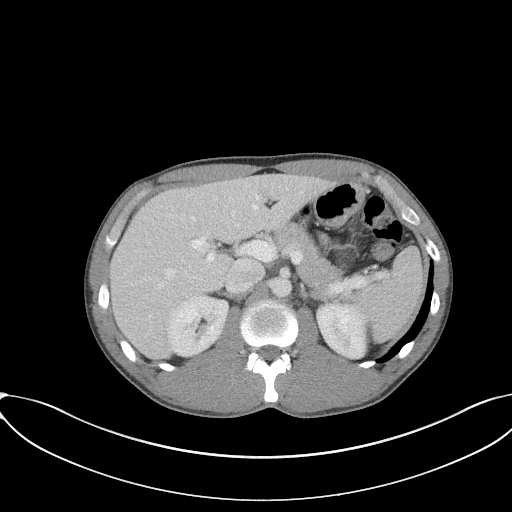
[im 79/91  soft-tissue]
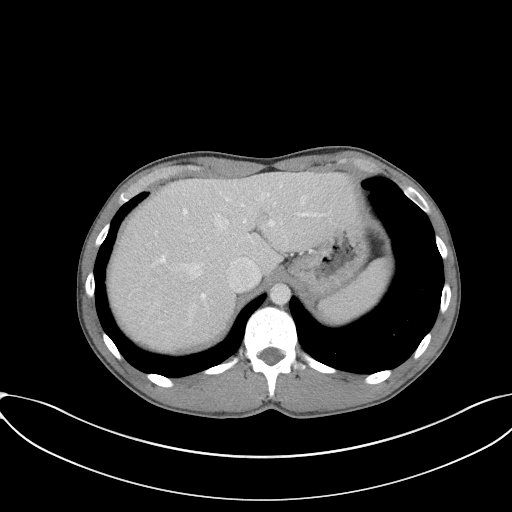
[im 87/91  soft-tissue]
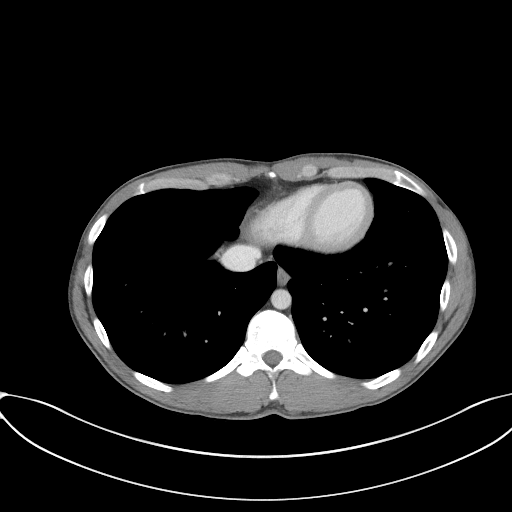

[Series 5: coronal st · coronal · 0.70mm/px · 3 of 76 slices shown]
[im 26/76  soft-tissue]
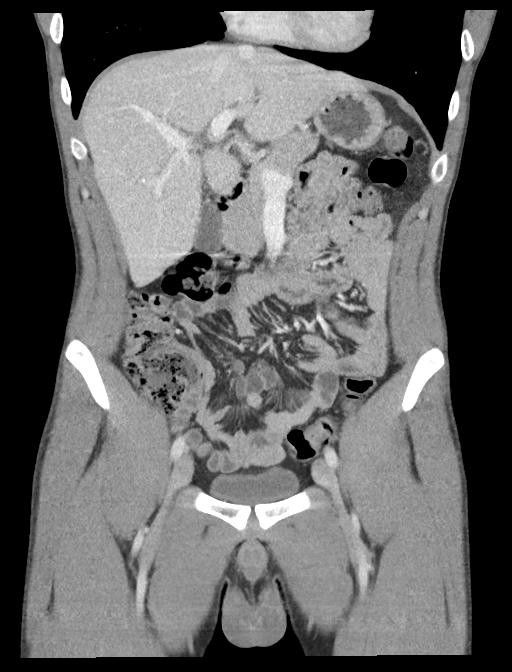
[im 34/76  soft-tissue]
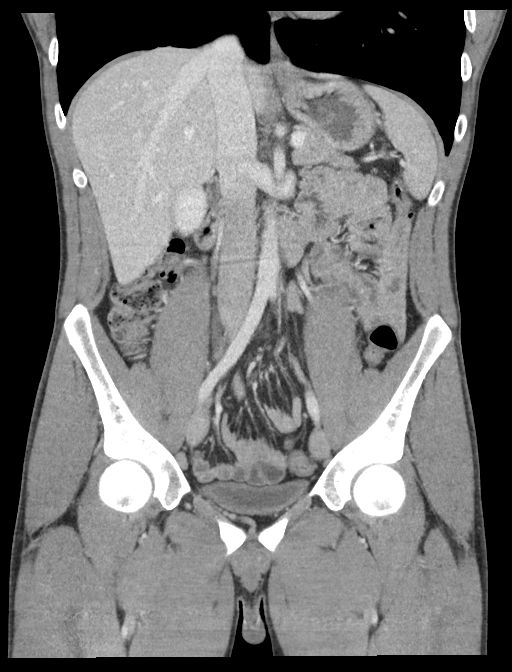
[im 42/76  soft-tissue]
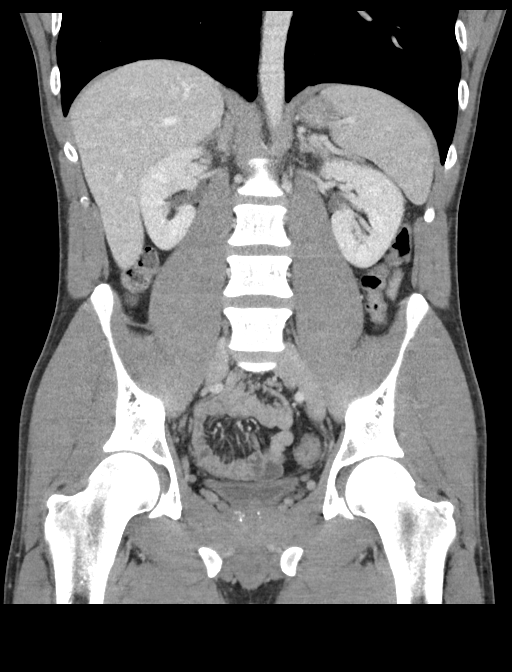

[16 of 46 positions shown; findings below may reference images not displayed]

RADIATION DOSE REDUCTION: This exam was performed according to the
departmental dose-optimization program which includes automated
exposure control, adjustment of the mA and/or kV according to
patient size and/or use of iterative reconstruction technique.

CONTRAST:  100mL OMNIPAQUE IOHEXOL 300 MG/ML  SOLN
FINDINGS: Lower chest: No acute abnormality.

Hepatobiliary: No focal hepatic abnormality. Gallbladder
unremarkable.

Pancreas: No focal abnormality or ductal dilatation.

Spleen: No focal abnormality.  Normal size.

Adrenals/Urinary Tract: No adrenal abnormality. No focal renal
abnormality. No stones or hydronephrosis. Urinary bladder is
unremarkable.

Stomach/Bowel: Normal appendix. Stomach, large and small bowel
grossly unremarkable.

Vascular/Lymphatic: No evidence of aneurysm or adenopathy.

Reproductive: No visible focal abnormality.

Other: No free fluid or free air. There is soft tissue stranding
seen within the right lower anterior abdominal wall. No drainable
fluid collection. Findings compatible with cellulitis.

Musculoskeletal: No acute bony abnormality.
IMPRESSION: Soft tissue stranding in the anterior subcutaneous soft tissues in
the right lower abdomen wall compatible with cellulitis. No
drainable focal fluid collection.

No intra-abdominal abnormality.

## 2024-01-26 ENCOUNTER — Other Ambulatory Visit: Payer: Self-pay

## 2024-01-26 ENCOUNTER — Emergency Department
Admission: EM | Admit: 2024-01-26 | Discharge: 2024-01-26 | Disposition: A | Discharge status: Home / Self Care | Payer: Self-pay | Attending: Emergency Medicine | Admitting: Emergency Medicine

## 2024-01-26 DIAGNOSIS — Z139 Encounter for screening, unspecified: Secondary | ICD-10-CM | POA: Insufficient documentation

## 2024-01-26 DIAGNOSIS — Z760 Encounter for issue of repeat prescription: Secondary | ICD-10-CM | POA: Insufficient documentation

## 2024-01-26 HISTORY — DX: Schizophrenia, unspecified: F20.9

## 2024-01-26 HISTORY — DX: Anxiety disorder, unspecified: F41.9

## 2024-01-26 HISTORY — DX: Attention-deficit hyperactivity disorder, unspecified type: F90.9

## 2024-01-26 NOTE — ED Triage Notes (Signed)
 Pt comes with c/o needing medication refill. Pt states he has been out for awhile.

## 2024-01-26 NOTE — ED Provider Notes (Signed)
" ° °  [facility] Provider Note    Event Date/Time   First MD Initiated Contact with Patient 01/26/24 1105     (approximate)   History   Medication Refill   HPI  Brandon Krueger is a 34 y.o. male with a history of ADHD, anxiety, schizophrenia presents for medication refill.  Patient reports he has been out of his medication for some time.  Denies hallucinations, no SI no HI, here simply for medication refill.     Physical Exam   Triage Vital Signs: ED Triage Vitals  Encounter Vitals Group     BP 01/26/24 1021 (!) 137/96     Girls Systolic BP Percentile --      Girls Diastolic BP Percentile --      Boys Systolic BP Percentile --      Boys Diastolic BP Percentile --      Pulse Rate 01/26/24 1021 (!) 104     Resp 01/26/24 1021 18     Temp 01/26/24 1021 98.3 F (36.8 C)     Temp src --      SpO2 01/26/24 1021 99 %     Weight 01/26/24 1019 68 kg (150 lb)     Height 01/26/24 1019 1.753 m (5' 9)     Head Circumference --      Peak Flow --      Pain Score 01/26/24 1019 0     Pain Loc --      Pain Education --      Exclude from Growth Chart --     Most recent vital signs: Vitals:   01/26/24 1021  BP: (!) 137/96  Pulse: (!) 104  Resp: 18  Temp: 98.3 F (36.8 C)  SpO2: 99%     General: Awake, no distress.  CV:  Good peripheral perfusion.  Resp:  Normal effort.  Abd:  No distention.  Other:     ED Results / Procedures / Treatments   Labs (all labs ordered are listed, but only abnormal results are displayed) Labs Reviewed - No data to display   EKG     RADIOLOGY     PROCEDURES:  Critical Care performed:   Procedures   MEDICATIONS ORDERED IN ED: Medications - No data to display   IMPRESSION / MDM / ASSESSMENT AND PLAN / ED COURSE  I reviewed the triage vital signs and the nursing notes. Patient's presentation is most consistent with exacerbation of chronic illness.  Patient is here for medical screening, medication  refill.  He is medically screened for evaluation, will refer him to [facility].  No indication for emergent psychiatric consultation        FINAL CLINICAL IMPRESSION(S) / ED DIAGNOSES   Final diagnoses:  Encounter for medication refill  Encounter for medical screening examination     Rx / DC Orders   ED Discharge Orders     None        Note:  This document was prepared using Dragon voice recognition software and may include unintentional dictation errors.   Arlander Charleston, MD 01/26/24 1312  "
